# Patient Record
Sex: Male | Born: 1995 | Race: Asian | Hispanic: No | Marital: Single | Smoking: Never smoker
Health system: Southern US, Community
[De-identification: ages and names within clinical notes are randomized; demographics above are authoritative.]

---
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
## 2014-07-18 ENCOUNTER — Emergency Department (HOSPITAL_COMMUNITY)
Admission: EM | Admit: 2014-07-18 | Discharge: 2014-07-18 | Disposition: A | Discharge status: Home / Self Care | Payer: BC Managed Care – PPO | Attending: Emergency Medicine | Admitting: Emergency Medicine

## 2014-07-18 ENCOUNTER — Emergency Department (HOSPITAL_COMMUNITY): Payer: BC Managed Care – PPO

## 2014-07-18 ENCOUNTER — Encounter (HOSPITAL_COMMUNITY): Payer: Self-pay

## 2014-07-18 DIAGNOSIS — W2106XA Struck by volleyball, initial encounter: Secondary | ICD-10-CM | POA: Insufficient documentation

## 2014-07-18 DIAGNOSIS — Y998 Other external cause status: Secondary | ICD-10-CM | POA: Insufficient documentation

## 2014-07-18 DIAGNOSIS — Y9239 Other specified sports and athletic area as the place of occurrence of the external cause: Secondary | ICD-10-CM | POA: Insufficient documentation

## 2014-07-18 DIAGNOSIS — S63286A Dislocation of proximal interphalangeal joint of right little finger, initial encounter: Secondary | ICD-10-CM | POA: Insufficient documentation

## 2014-07-18 DIAGNOSIS — Y9368 Activity, volleyball (beach) (court): Secondary | ICD-10-CM | POA: Diagnosis not present

## 2014-07-18 DIAGNOSIS — S61206A Unspecified open wound of right little finger without damage to nail, initial encounter: Secondary | ICD-10-CM | POA: Diagnosis not present

## 2014-07-18 DIAGNOSIS — IMO0001 Reserved for inherently not codable concepts without codable children: Secondary | ICD-10-CM

## 2014-07-18 DIAGNOSIS — S6990XA Unspecified injury of unspecified wrist, hand and finger(s), initial encounter: Secondary | ICD-10-CM

## 2014-07-18 DIAGNOSIS — S63289A Dislocation of proximal interphalangeal joint of unspecified finger, initial encounter: Secondary | ICD-10-CM

## 2014-07-18 DIAGNOSIS — S6991XA Unspecified injury of right wrist, hand and finger(s), initial encounter: Secondary | ICD-10-CM | POA: Diagnosis present

## 2014-07-18 MED ORDER — CEPHALEXIN 500 MG PO CAPS: 500.0000 mg | ORAL_CAPSULE | Freq: Four times a day (QID) | ORAL | Status: AC

## 2014-07-18 MED ORDER — CEFAZOLIN SODIUM 1-5 GM-% IV SOLN
1.0000 g | Freq: Once | INTRAVENOUS | Status: AC
  Administered 2014-07-18: 1 g via INTRAVENOUS
  Filled 2014-07-18: qty 50

## 2014-07-18 MED ORDER — HYDROCODONE-ACETAMINOPHEN 5-325 MG PO TABS: 1.0000 | ORAL_TABLET | ORAL | Status: AC | PRN

## 2014-07-18 MED ORDER — LIDOCAINE HCL 1 % IJ SOLN
5.0000 mL | Freq: Once | INTRAMUSCULAR | Status: AC
  Administered 2014-07-18: 5 mL
  Filled 2014-07-18: qty 20

## 2014-07-18 NOTE — ED Notes (Signed)
Patient transported to X-ray 

## 2014-07-18 NOTE — ED Notes (Signed)
Broke finger playing volleyball.  Bone exposed

## 2014-07-18 NOTE — Discharge Instructions (Signed)
Finger Dislocation °Finger dislocation is the displacement of bones in your finger at the joints. Most commonly, finger dislocation occurs at the proximal interphalangeal joint (the joint closest to your knuckle). Very strong, fibrous tissues (ligaments) and joint capsules connect the three bones of your fingers.  °CAUSES °Dislocation is caused by a forceful impact. This impact moves these bones off the joint and often tears your ligaments.  °SYMPTOMS °Symptoms of finger dislocation include: °· Deformity of your finger. °· Pain, with loss of movement. °DIAGNOSIS  °Finger dislocation is diagnosed with a physical exam. Often, X-ray exams are done to see if you have associated injuries, such as bone fractures. °TREATMENT  °Finger dislocations are treated by putting your bones back into position (reduction) either by manually moving the bones back into place or through surgery. Your finger is then kept in a fixed position (immobilized) with the use of a dressing or splint for a brief period. °When your ligament has to be surgically repaired, it needs to be kept in a fixed position with a dressing or splint for 1 to 2 weeks. Because joint stiffness is a long-term complication of finger dislocation, hand exercises or physical therapy to increase the range of motion and to regain strength is usually started as soon as the ligament is healed. Exercises and therapy generally last no more than 3 months. °HOME CARE INSTRUCTIONS °The following measures can help to reduce pain and speed up the healing process: °· Rest your injured joint. Do not move until instructed otherwise by your caregiver. Avoid activities similar to the one that caused your injury. °· Apply ice to your injured joint for the first day or 2 after your reduction or as directed by your caregiver. Applying ice helps to reduce inflammation and pain. °¨ Put ice in a plastic bag. °¨ Place a towel between your skin and the bag. °¨ Leave the ice on for 15-20 minutes  at a time, every 2 hours while you are awake. °· Elevate your hand above your heart as directed by your caregiver to reduce swelling. °· Take over-the-counter or prescription medicine for pain as your caregiver instructs you. °SEEK IMMEDIATE MEDICAL CARE IF: °· Your dressing or splint becomes damaged. °· Your pain becomes worse rather than better. °· You lose feeling in your finger, or it becomes cold and white. °MAKE SURE YOU: °· Understand these instructions. °· Will watch your condition. °· Will get help right away if you are not doing well or get worse. °Document Released: 08/01/2000 Document Revised: 10/27/2011 Document Reviewed: 05/25/2011 °[employer]® Patient Information ©2015 [employer]. This information is not intended to replace advice given to you by your health care provider. Make sure you discuss any questions you have with your health care provider. ° °

## 2014-07-18 NOTE — ED Provider Notes (Signed)
LACERATION REPAIR Performed by: Wandra MannanBrittany Manning  PA-S, with my assistance Jaynie CrumbleKIRICHENKO, Cosby Proby A PA-C Authorized by: Jaynie CrumbleKIRICHENKO, Kooper Godshall A Consent: Verbal consent obtained. Risks and benefits: risks, benefits and alternatives were discussed Consent given by: patient Patient identity confirmed: provided demographic data Prepped and Draped in normal sterile fashion Wound explored  Laceration Location: right 5th finger  Laceration Length: 3cm  No Foreign Bodies seen or palpated  Anesthesia: digital block, already performed by Dr. Jolly MangoKnapp  Irrigation method: syringe Amount of cleaning: standard  Skin closure: prolene 4.0  Number of sutures: 3  Technique: simple interrupted  Patient tolerance: Patient tolerated the procedure well with no immediate complications.   Sean Musselatyana A Diora Bellizzi, PA-C 07/18/14 1929

## 2014-07-18 NOTE — ED Provider Notes (Signed)
CSN: [id number]     Arrival date & time 07/18/14  1656 History   First MD Initiated Contact with Patient 07/18/14 1733     Chief Complaint  Patient presents with  . Finger Injury    HPI The patient was playing volleyball today when the ball hit his right fifth pinky finger. The patient immediately noticed a deformity of his fifth finger. He also had an open wound. Patient denies any distal numbness. He has decreased range of motion but denies specific weakness. Denies any other injuries. History reviewed. No pertinent past medical history. History reviewed. No pertinent past surgical history. Family History  Problem Relation Age of Onset  . Hyperlipidemia Mother   . Hyperlipidemia Father    History  Substance Use Topics  . Smoking status: Never Smoker   . Smokeless tobacco: Not on file  . Alcohol Use: No    Review of Systems  All other systems reviewed and are negative.     Allergies  Review of patient's allergies indicates no known allergies.  Home Medications   Prior to Admission medications   Medication Sig Start Date End Date Taking? Authorizing Provider  cephALEXin (KEFLEX) 500 MG capsule Take 1 capsule (500 mg total) by mouth 4 (four) times daily. 07/18/14   Linwood DibblesJon Henreitta Spittler, MD  HYDROcodone-acetaminophen (NORCO/VICODIN) 5-325 MG per tablet Take 1-2 tablets by mouth every 4 (four) hours as needed. 07/18/14   Linwood DibblesJon Azeneth Carbonell, MD   BP 116/67 mmHg  Pulse 88  Temp(Src) 97.9 F (36.6 C) (Oral)  Resp 16  SpO2 99% Physical Exam  Constitutional: He appears well-developed and well-nourished. No distress.  HENT:  Head: Normocephalic and atraumatic.  Right Ear: External ear normal.  Left Ear: External ear normal.  Eyes: Conjunctivae are normal. Right eye exhibits no discharge. Left eye exhibits no discharge. No scleral icterus.  Neck: Neck supple. No tracheal deviation present.  Cardiovascular: Normal rate.   Pulmonary/Chest: Effort normal. No stridor. No respiratory distress.    Musculoskeletal: He exhibits no edema.       Hands: Neurological: He is alert. Cranial nerve deficit: no gross deficits.  Skin: Skin is warm and dry. No rash noted.  Psychiatric: He has a normal mood and affect.  Nursing note and vitals reviewed.   ED Course  Reduction of dislocation Date/Time: 07/18/2014 6:37 PM Performed by: Linwood DibblesKNAPP, Diron Haddon Authorized by: Linwood DibblesKNAPP, Kelita Wallis Consent: Verbal consent obtained. Risks and benefits: risks, benefits and alternatives were discussed Consent given by: patient Time out: Immediately prior to procedure a "time out" was called to verify the correct patient, procedure, equipment, support staff and site/side marked as required. Local anesthesia used: yes Anesthesia: local infiltration Local anesthetic: lidocaine 1% without epinephrine and co-phenylcaine spray Anesthetic total: 3 ml Patient sedated: no Patient tolerance: Patient tolerated the procedure well with no immediate complications Comments: Reduction of PIP dislocation with traction and manipulation of middle phalanx.  180 cc normal saline used for wound irrigation prior to the reduction   ( Labs Review Labs Reviewed - No data to display  Imaging Review Dg Hand 2 View Right  07/18/2014   CLINICAL DATA:  Status post reduction of the PIP joint of the right little finger.  EXAM: RIGHT HAND - 2 VIEW  COMPARISON:  Plain films right hand 07/18/2014 at 1734 hr.  FINDINGS: The PIP joint of right little finger has been reduced. No fracture is identified. No new abnormality is seen. Ulnar minus variance is noted.  IMPRESSION: Successful reduction of dislocation of the PIP  joint of the right little finger. No acute abnormality.   Electronically Signed   By: Drusilla Kannerhomas  Dalessio M.D.   On: 07/18/2014 18:57   Dg Hand Complete Right  07/18/2014   CLINICAL DATA:  18 year old male with right small finger dislocation while playing volleyball earlier today  EXAM: RIGHT HAND - COMPLETE 3+ VIEW  COMPARISON:  None.  FINDINGS:  Dorsal and ulnar dislocation of the middle phalanx with respect to the proximal phalanx of the small finger. No acute fracture. Marked soft tissue deformity overlying the distal aspect of the proximal phalanx. Normal bony mineralization. No lytic or blastic osseous lesion.  IMPRESSION: Dorsal and ulnar dislocation of the middle phalanx with respect to the proximal phalanx of the small finger.  No evidence of acute fracture on these views.   Electronically Signed   By: Malachy MoanHeath  McCullough M.D.   On: 07/18/2014 17:40   Laceration repaired by PA Kirichenko.  MDM   Final diagnoses:  Dislocation  Dislocation of finger PIP joint, initial encounter    Dislocation reduced.  Laceration repaired.  Discussed with Dr Izora Ribasoley.  Will splint and give patient oral abx for prophylaxis associated with his open dislocation.   Linwood DibblesJon Detrich Rakestraw, MD 07/18/14 [phone]

## 2015-06-09 IMAGING — DX DG HAND 2V*R*
2 series · 2 of 2 positions shown · non-contrast
Comparison: Plain films right hand 07/18/2014 at 5269 hr.

CLINICAL DATA: Status post reduction of the PIP joint of the right
little finger.

EXAM:
RIGHT HAND - 2 VIEW

[hand pa]
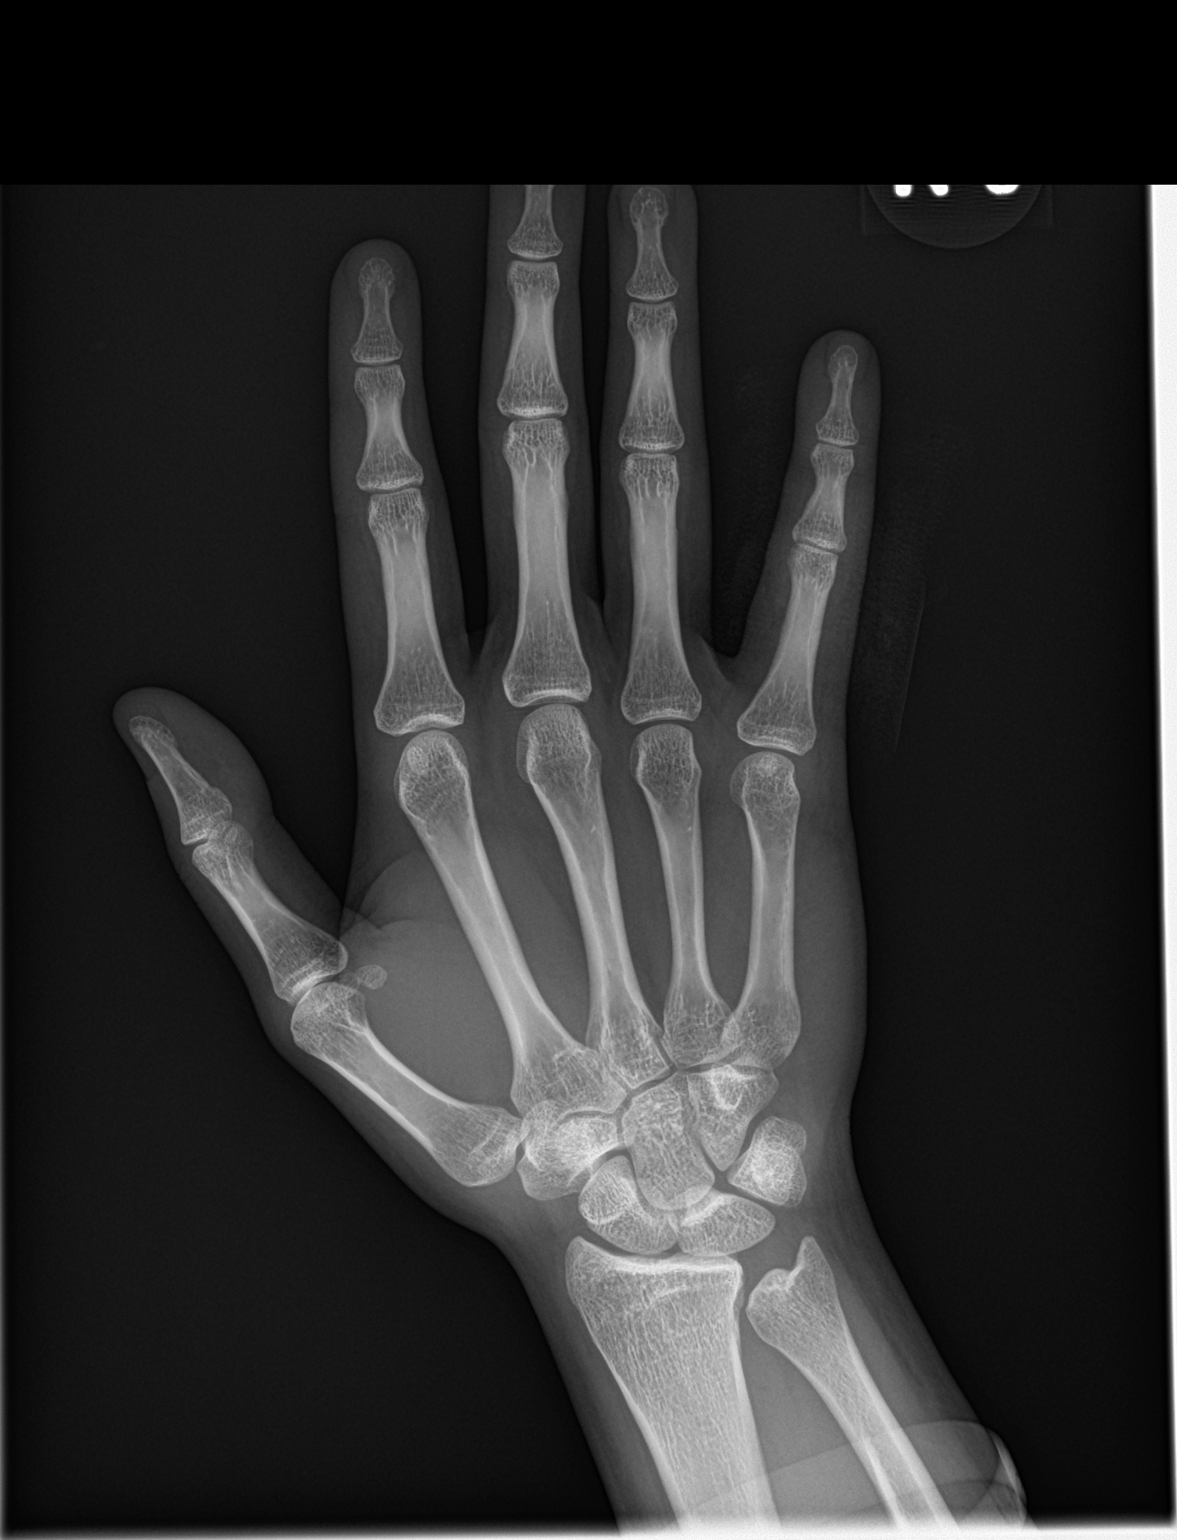

[hand lat]
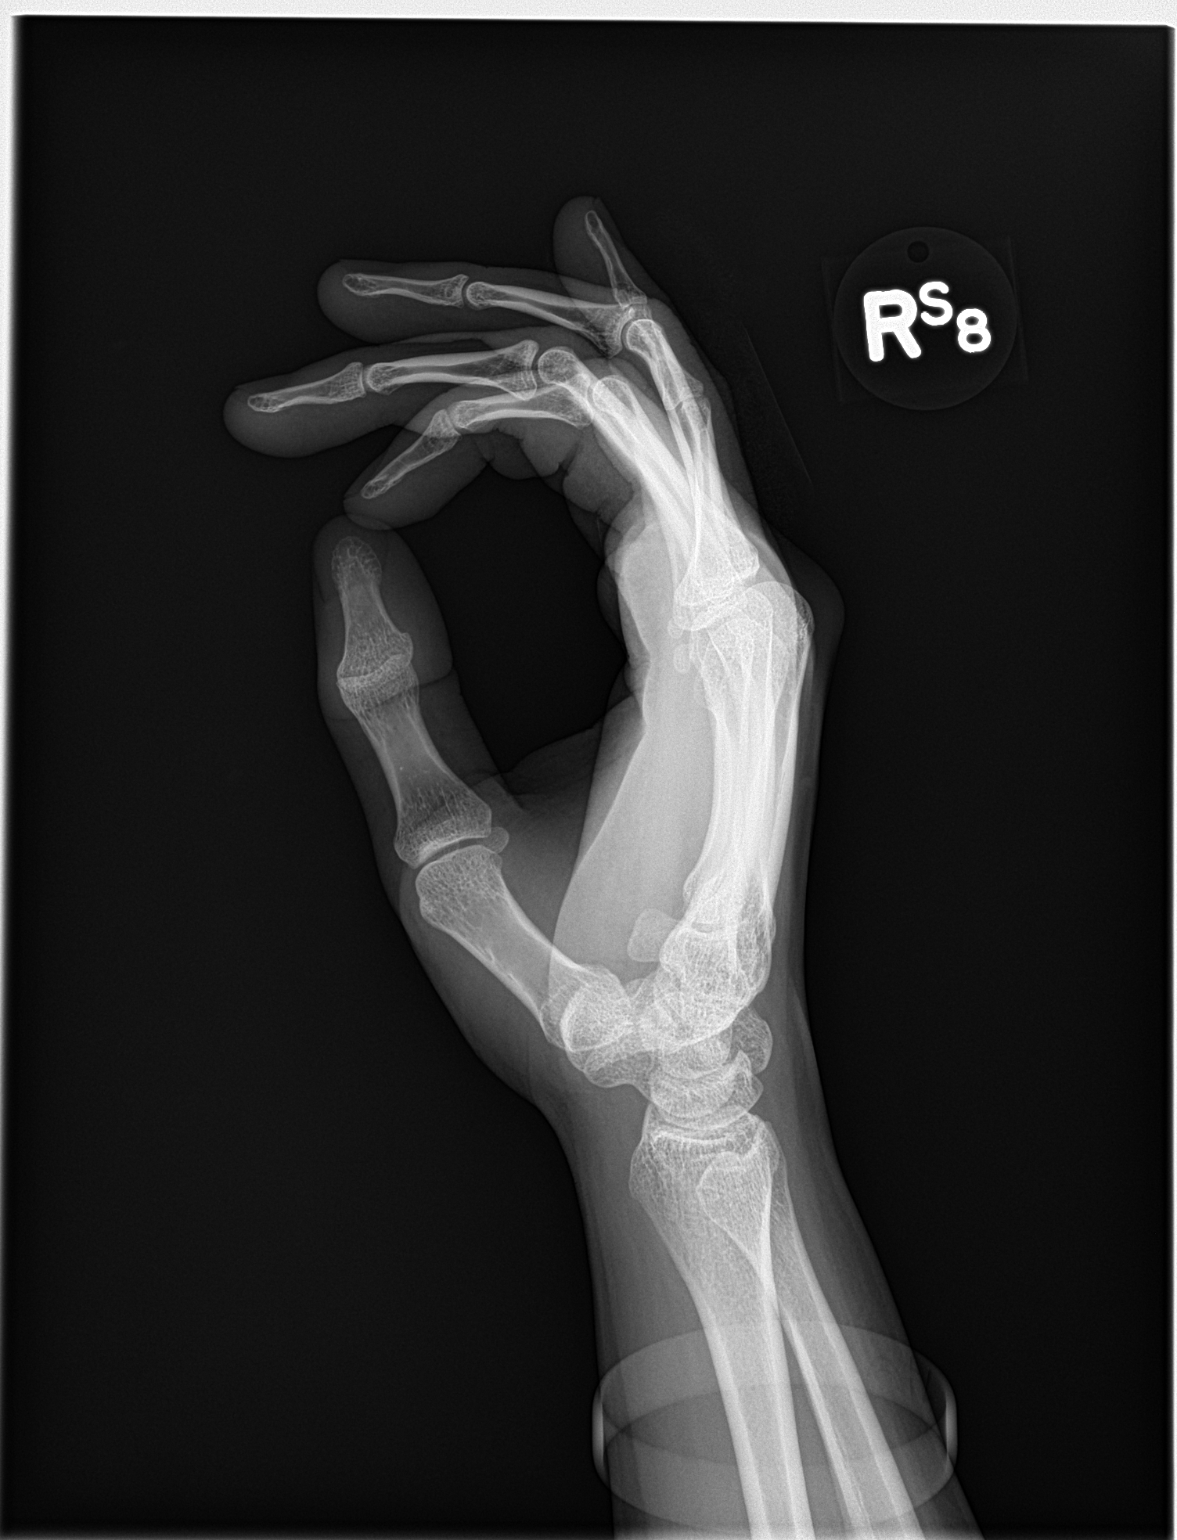

[2 of 2 positions shown; findings below may reference images not displayed]

FINDINGS: The PIP joint of right little finger has been reduced. No fracture
is identified. No new abnormality is seen. Ulnar minus variance is
noted.
IMPRESSION: Successful reduction of dislocation of the PIP joint of the right
little finger. No acute abnormality.
